# Patient Record
Sex: Male | Born: 1980 | Race: Black or African American | Hispanic: No | Marital: Single | Smoking: Former smoker
Health system: Southern US, Community
[De-identification: ages and names within clinical notes are randomized; demographics above are authoritative.]

## PROBLEM LIST (undated history)

## (undated) DIAGNOSIS — F419 Anxiety disorder, unspecified: Secondary | ICD-10-CM

## (undated) DIAGNOSIS — K219 Gastro-esophageal reflux disease without esophagitis: Secondary | ICD-10-CM

## (undated) DIAGNOSIS — J45909 Unspecified asthma, uncomplicated: Secondary | ICD-10-CM

## (undated) HISTORY — DX: Anxiety disorder, unspecified: F41.9

## (undated) HISTORY — DX: Gastro-esophageal reflux disease without esophagitis: K21.9

## (undated) HISTORY — DX: Unspecified asthma, uncomplicated: J45.909

---

## 1999-05-26 ENCOUNTER — Encounter: Payer: Self-pay | Admitting: Emergency Medicine

## 1999-05-26 ENCOUNTER — Inpatient Hospital Stay (HOSPITAL_COMMUNITY): Admission: EM | Admit: 1999-05-26 | Discharge: 1999-05-27 | Payer: Self-pay | Admitting: Emergency Medicine

## 1999-05-27 ENCOUNTER — Encounter: Payer: Self-pay | Admitting: Internal Medicine

## 1999-06-01 ENCOUNTER — Ambulatory Visit (HOSPITAL_COMMUNITY): Admission: RE | Admit: 1999-06-01 | Discharge: 1999-06-01 | Payer: Self-pay | Admitting: Critical Care Medicine

## 1999-06-01 ENCOUNTER — Encounter: Payer: Self-pay | Admitting: Critical Care Medicine

## 1999-08-26 ENCOUNTER — Ambulatory Visit (HOSPITAL_COMMUNITY): Admission: RE | Admit: 1999-08-26 | Discharge: 1999-08-26 | Payer: Self-pay | Admitting: *Deleted

## 1999-08-26 ENCOUNTER — Encounter: Payer: Self-pay | Admitting: *Deleted

## 2000-05-16 ENCOUNTER — Encounter: Payer: Self-pay | Admitting: Internal Medicine

## 2000-05-16 ENCOUNTER — Inpatient Hospital Stay (HOSPITAL_COMMUNITY): Admission: EM | Admit: 2000-05-16 | Discharge: 2000-05-17 | Payer: Self-pay | Admitting: Emergency Medicine

## 2000-08-18 ENCOUNTER — Emergency Department (HOSPITAL_COMMUNITY): Admission: EM | Admit: 2000-08-18 | Discharge: 2000-08-18 | Payer: Self-pay | Admitting: Emergency Medicine

## 2000-08-18 ENCOUNTER — Encounter: Payer: Self-pay | Admitting: Emergency Medicine

## 2005-07-23 ENCOUNTER — Emergency Department (HOSPITAL_COMMUNITY): Admission: EM | Admit: 2005-07-23 | Discharge: 2005-07-23 | Payer: Self-pay | Admitting: Emergency Medicine

## 2008-07-03 ENCOUNTER — Emergency Department (HOSPITAL_COMMUNITY): Admission: EM | Admit: 2008-07-03 | Discharge: 2008-07-03 | Payer: Self-pay | Admitting: Emergency Medicine

## 2010-03-23 ENCOUNTER — Emergency Department (HOSPITAL_COMMUNITY): Admission: EM | Admit: 2010-03-23 | Discharge: 2010-03-23 | Payer: Self-pay | Admitting: Emergency Medicine

## 2011-01-22 NOTE — H&P (Signed)
[place]. [facility]  Patient:    Edward Chaney, Edward Chaney                     MRN: [id number] Adm. Date:  [id number] Disc. Date: [id number] Attending:  Young, Copy                         History and Physical  ADMITTING DIAGNOSIS:  Status asthmaticus.  HISTORY OF PRESENT ILLNESS:  A 30 year old nonsmoking African-American man with chronic asthma, who experienced onset of an acute attack about 9 p.m. on the evening before admission.  He denies recent cold or trigger.  He presented to the emergency room during the night and was still wheezing after continuous nebulizer treatment of steroids and magnesium and is admitted for stabilization.  REVIEW OF SYSTEMS:  Denies pain, fever, sore throat, purulent sputum, GI upset of palpitation.  ALLERGIES:  No medication allergies.  FAMILY HISTORY:  Uncle with diabetes.  Mother with hypertension.  Aunt hypertensive.  Both parents are living.  SOCIAL HISTORY:  Nonsmoking.  Currently a senior at [employer]. Married.  HOME MEDICATIONS:  Advair inhaler, Accolate, Claritin, albuterol.  PAST MEDICAL HISTORY:  No surgery.  No significant illnesses other than intermittent asthma, with previous hospitalization for asthma.  No history of heart disease, diabetes, or hypertension.  PHYSICAL EXAMINATION:  GENERAL:  Tired and drowsy, having been awake most of the night, but alert and fully oriented, appropriate, and responsive.  A well-developed, well-nourished black male.  VITAL SIGNS:  Room air saturations 94%, temperature 96.7, blood pressure 148/75, pulse 91, respirations 26.  SKIN:  No rash.  ADENOPATHY:  None found.  HEENT:  Atraumatic.  Pupils equal and reactive, conjunctivae clear.  Oral mucosa clear.  No stridor or neck vein distention.  No thyromegaly.  CHEST:  Not using accessory muscles by the time I examined him.  Lying with bed at 30 degrees head of bed elevation.  Bilateral inspiratory and  expiratory wheezes throughout the chest, speaking in short sentences, with no cough.  HEART:  Regular rhythm, about 90 per minute.  No murmur or rub.  ABDOMEN:  Soft, nontender, without hepatosplenomegaly.  GENITALIA/RECTAL:  Not examined, not indicated at this time.  EXTREMITIES:  No clubbing, cyanosis, or edema.  No tremor.  ADDENDUM:  Subsequently told that he has been treated through [facility] for a urinary tract infection on Floxin, which he has nearly completed.  IMPRESSION:  Status asthmaticus.  In any young adult, I always question whether the attack has been building longer than he recognized, and I need to determine whether he has been able to be compliant with medication, although that has not usually been a problem with this individual.  We will spend more time on education, and he has an early outpatient office visit already scheduled.  He is being admitted for intravenous steroids, bronchodilators, and antibiotics.  Doxycycline ordered initially is being replaced with Floxin to continue his urinary tract therapy while also covering any possible airway bacterial infection. DD:  05/17/00 TD:  05/17/00 Job: [id number]

## 2011-01-22 NOTE — Discharge Summary (Signed)
[name]. [facility]  Patient:    Edward Chaney, Edward Chaney                     MRN: [id number] Adm. Date:  [id number] Disc. Date: [id number] Attending:  Jetty Duhamel Driver                           Discharge Summary  ADMISSION DIAGNOSIS:  Status asthmaticus.  BRIEF HISTORY:  A 30 year old, nonsmoking, African-American male with chronic asthma, who presented with an asthma attack about 9 p.m. on the night prior to admission, not having recognized any obvious triggers.  He continued wheezing after continuous nebulizer treatment, intravenous steroids, and magnesium in the emergency room and was admitted for stabilization.  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  PHYSICAL EXAMINATION:  He was tired from having been awake all night.  VITAL SIGNS:  The room air saturation was 94%.  Respiratory rate elevated at 26.  Temperature 96.7 degrees.  CHEST:  Bilateral inspiratory and expiratory wheezes and he was speaking in short sentences.  HOSPITAL COURSE:  He was placed on intravenous steroids, aerosolized bronchodilators, and doxycycline and had an educational discussion regarding peak flow meters, the warning signs for asthma, and medication use.  He improved significantly and by the next morning was comfortable and ready for discharge with lung fields clear, pulse normal, and breathing unlabored.  He was noted to have an incidental urinary tract infection which was already being treated with Floxin through [facility] and this was continued.  LABORATORY DATA:  WBC 11,000, hemoglobin 12.9, 1% eosinophils, 4% lymphocytes. Admission potassium 3.4, glucose 126.  The urinalysis showed 6-10 white blood cells with rare bacteria.  Room air saturation was 96%.  DISCHARGE MEDICATIONS: 1. Advair Diskus one inhalation b.i.d. 2.  Accolate 20 mg b.i.d. 3. Albuterol inhaler two puffs q.i.d. p.r.n. 4. Claritin 10 mg p.o. q.d. p.r.n. 5. Floxin 200 mg b.i.d. to finish course. 6. Prednisone 5 mg tablets, #40, to taper over eight days from 40 mg.  DIET:  Unrestricted.  ACTIVITY:  Unrestricted.  FOLLOW-UP:  He is to keep an office follow-up appointment already scheduled for May 19, 2000. DD:  06/02/00 TD:  06/02/00 Job: [id number]

## 2011-10-22 ENCOUNTER — Other Ambulatory Visit: Payer: Self-pay | Admitting: Neurology

## 2011-10-22 DIAGNOSIS — M8560 Other cyst of bone, unspecified site: Secondary | ICD-10-CM

## 2011-10-27 ENCOUNTER — Other Ambulatory Visit: Payer: Self-pay

## 2011-12-23 ENCOUNTER — Other Ambulatory Visit: Payer: Self-pay

## 2012-01-14 ENCOUNTER — Institutional Professional Consult (permissible substitution): Payer: Self-pay | Admitting: Internal Medicine

## 2012-02-14 ENCOUNTER — Other Ambulatory Visit: Payer: Self-pay

## 2012-02-16 ENCOUNTER — Other Ambulatory Visit: Payer: Self-pay

## 2012-02-18 ENCOUNTER — Ambulatory Visit
Admission: RE | Admit: 2012-02-18 | Discharge: 2012-02-18 | Disposition: A | Discharge status: Home / Self Care | Payer: Managed Care, Other (non HMO) | Source: Ambulatory Visit | Attending: Neurology | Admitting: Neurology

## 2012-02-18 DIAGNOSIS — M8560 Other cyst of bone, unspecified site: Secondary | ICD-10-CM

## 2012-02-18 MED ORDER — IOHEXOL 300 MG/ML  SOLN
100.0000 mL | Freq: Once | INTRAMUSCULAR | Status: AC | PRN
  Administered 2012-02-18: 100 mL via INTRAVENOUS

## 2012-02-21 ENCOUNTER — Institutional Professional Consult (permissible substitution): Payer: Self-pay | Admitting: Internal Medicine

## 2012-03-14 ENCOUNTER — Ambulatory Visit (INDEPENDENT_AMBULATORY_CARE_PROVIDER_SITE_OTHER): Payer: Managed Care, Other (non HMO) | Admitting: Internal Medicine

## 2012-03-14 ENCOUNTER — Encounter: Payer: Self-pay | Admitting: Internal Medicine

## 2012-03-14 VITALS — BP 124/88 | HR 49 | Ht 71.5 in | Wt 188.4 lb

## 2012-03-14 DIAGNOSIS — J45998 Other asthma: Secondary | ICD-10-CM

## 2012-03-14 DIAGNOSIS — J45909 Unspecified asthma, uncomplicated: Secondary | ICD-10-CM

## 2012-03-14 DIAGNOSIS — K219 Gastro-esophageal reflux disease without esophagitis: Secondary | ICD-10-CM

## 2012-03-14 MED ORDER — ALBUTEROL SULFATE HFA 108 (90 BASE) MCG/ACT IN AERS: 2.0000 | INHALATION_SPRAY | RESPIRATORY_TRACT | Status: DC | PRN

## 2012-03-14 MED ORDER — MOMETASONE FURO-FORMOTEROL FUM 100-5 MCG/ACT IN AERO: 2.0000 | INHALATION_SPRAY | Freq: Two times a day (BID) | RESPIRATORY_TRACT | Status: DC

## 2012-03-14 NOTE — Progress Notes (Signed)
79/13 - 44 yoM  Former smoker here at kind request of Dr.Pharr, concerned about asthma. He was a previous patient at the old office when he was diagnosed in 51. He had been generally well-controlled using Advair. This year he began having more trouble during the spring weather, with increased wheeze and shortness of breath. Easier dyspnea on exertion. Sample of Advair gave him a sore throat but otherwise helped. He has just been using his rescue inhaler. Asthma wakes him. Admits frequent heartburn and we discussed possible relation to her asthma. Blows his nose each morning but otherwise not much nasal complaints. Gets regular flu shot. He is old records are being brought from warehouse. Family history of several of asthma. Smoked 2-3 cigarettes daily for 5 years before quitting in 2011. 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 He denies significant mold or respiratory distress associated with his home or work place. He is doing well currently, but wanted to reestablish, anticipating future needs.  Prior to Admission medications   Medication Sig Start Date End Date Taking? Authorizing Provider  albuterol (PROAIR HFA) 108 (90 BASE) MCG/ACT inhaler Inhale 2 puffs into the lungs 4 (four) times daily as needed.   Yes Historical Provider, MD  albuterol (PROVENTIL HFA;VENTOLIN HFA) 108 (90 BASE) MCG/ACT inhaler Inhale 2 puffs into the lungs every 4 (four) hours as needed for wheezing or shortness of breath. 03/14/12 03/14/13  Waymon Budge, MD  mometasone-formoterol (DULERA) 100-5 MCG/ACT AERO Inhale 2 puffs into the lungs 2 (two) times daily. 03/14/12 03/14/13  Waymon Budge, MD   Past Medical History  Diagnosis Date  . Asthma    History reviewed. No pertinent past surgical history. Family History  Problem Relation Age of Onset  . Allergies Mother   . Asthma Maternal Aunt   . Asthma Maternal Uncle   .  Asthma Paternal Grandfather    History   Social History  . Marital Status: Single    Spouse Name: N/A    Number of Children: 2  . Years of Education: N/A   Occupational History  .  [employer]   Social History Main Topics  . Smoking status: Former Smoker -- 0.2 packs/day for 6 years    Types: Cigarettes    Quit date: 09/06/2009  . Smokeless tobacco: Not on file  . Alcohol Use: 4.2 oz/week    7 Cans of beer per week  . Drug Use: No  . Sexually Active: Not on file   Other Topics Concern  . Not on file   Social History Narrative  . No narrative on file   ROS-see HPI Constitutional:   No-   weight loss, night sweats, fevers, chills, fatigue, lassitude. HEENT:   No-  headaches, difficulty swallowing, tooth/dental problems, sore throat,       No-  sneezing, itching, ear ache, nasal congestion, post nasal drip,  CV:  No-   chest pain, orthopnea, PND, swelling in lower extremities, anasarca, dizziness, palpitations Resp: +  shortness of breath with exertion or at rest.              No-   productive cough,  No non-productive cough,  No- coughing up of blood.              No-   change in color of mucus.  No- wheezing.   Skin: No-   rash or lesions. GI:  No-   heartburn, indigestion, abdominal pain, nausea, vomiting, diarrhea,                 change in bowel habits, loss of appetite GU: No-   dysuria, change in color of urine, no urgency or frequency.  No- flank pain. MS:  No-   joint pain or swelling.  No- decreased range of motion.  No- back pain. Neuro-     nothing unusual Psych:  No- change in mood or affect. No depression or anxiety.  No memory loss.  OBJ- Physical Exam General- Alert, Oriented, Affect-appropriate, Distress- none acute, well-appearing Skin- rash-none, lesions- none, excoriation- none Lymphadenopathy- none Head- atraumatic            Eyes- Gross vision intact, PERRLA, conjunctivae and secretions clear            Ears- Hearing, canals-normal             Nose- Clear, no-Septal dev, mucus, polyps, erosion, perforation             Throat- Mallampati II , mucosa clear , drainage- none, tonsils- atrophic Neck- flexible , trachea midline, no stridor , thyroid nl, carotid no bruit Chest - symmetrical excursion , unlabored           Heart/CV- RRR , no murmur , no gallop  , no rub, nl s1 s2                           - JVD- none , edema- none, stasis changes- none, varices- none           Lung- clear to P&A, wheeze- none, cough- none , dullness-none, rub- none           Chest wall-  Abd- tender-no, distended-no, bowel sounds-present, HSM- no Br/ Gen/ Rectal- Not done, not indicated Extrem- cyanosis- none, clubbing, none, atrophy- none, strength- nl Neuro- grossly intact to observation

## 2012-03-14 NOTE — Patient Instructions (Addendum)
Script for proair albuterol rescue inhaler   Sample and script for Dulera 100      2 puffs then rinse mouth, twice daily  Maintenance controller  Suggest regular use of an otc acid blocker like omeprazole or famotidine

## 2012-03-21 DIAGNOSIS — K219 Gastro-esophageal reflux disease without esophagitis: Secondary | ICD-10-CM | POA: Insufficient documentation

## 2012-03-21 DIAGNOSIS — J45998 Other asthma: Secondary | ICD-10-CM | POA: Insufficient documentation

## 2012-03-21 NOTE — Assessment & Plan Note (Addendum)
Long-standing history of asthma. He is currently under good control but it sounds as if GERD and maybe allergy and viral infections have been important triggers. We discussed controller versus rescue medications. Plan-prescription refill for pro air. Introduce Dulera.

## 2012-03-21 NOTE — Assessment & Plan Note (Signed)
He recognizes some night time waking we discussed the importance of control and the potential to aggravate asthma. He will talk with Dr. Renne Crigler about management.

## 2012-11-03 ENCOUNTER — Encounter: Payer: Self-pay | Admitting: Internal Medicine

## 2012-11-29 ENCOUNTER — Encounter (HOSPITAL_COMMUNITY): Payer: Self-pay | Admitting: *Deleted

## 2012-11-29 ENCOUNTER — Emergency Department (HOSPITAL_COMMUNITY)
Admission: EM | Admit: 2012-11-29 | Discharge: 2012-11-29 | Disposition: A | Discharge status: Home / Self Care | Payer: Managed Care, Other (non HMO) | Attending: Emergency Medicine | Admitting: Emergency Medicine

## 2012-11-29 DIAGNOSIS — R7309 Other abnormal glucose: Secondary | ICD-10-CM | POA: Insufficient documentation

## 2012-11-29 DIAGNOSIS — J3489 Other specified disorders of nose and nasal sinuses: Secondary | ICD-10-CM | POA: Insufficient documentation

## 2012-11-29 DIAGNOSIS — R109 Unspecified abdominal pain: Secondary | ICD-10-CM | POA: Insufficient documentation

## 2012-11-29 DIAGNOSIS — R112 Nausea with vomiting, unspecified: Secondary | ICD-10-CM | POA: Insufficient documentation

## 2012-11-29 DIAGNOSIS — Z79899 Other long term (current) drug therapy: Secondary | ICD-10-CM | POA: Insufficient documentation

## 2012-11-29 DIAGNOSIS — Z87891 Personal history of nicotine dependence: Secondary | ICD-10-CM | POA: Insufficient documentation

## 2012-11-29 DIAGNOSIS — B349 Viral infection, unspecified: Secondary | ICD-10-CM

## 2012-11-29 DIAGNOSIS — R197 Diarrhea, unspecified: Secondary | ICD-10-CM | POA: Insufficient documentation

## 2012-11-29 DIAGNOSIS — B9789 Other viral agents as the cause of diseases classified elsewhere: Secondary | ICD-10-CM | POA: Insufficient documentation

## 2012-11-29 DIAGNOSIS — R739 Hyperglycemia, unspecified: Secondary | ICD-10-CM

## 2012-11-29 LAB — CBC WITH DIFFERENTIAL/PLATELET
Basophils Absolute: 0 10*3/uL (ref 0.0–0.1)
Basophils Relative: 0 % (ref 0–1)
Eosinophils Relative: 0 % (ref 0–5)
HCT: 44.8 % (ref 39.0–52.0)
Hemoglobin: 15.5 g/dL (ref 13.0–17.0)
MCHC: 34.6 g/dL (ref 30.0–36.0)
MCV: 94.1 fL (ref 78.0–100.0)
Monocytes Absolute: 0.3 10*3/uL (ref 0.1–1.0)
Monocytes Relative: 3 % (ref 3–12)
Neutro Abs: 7.9 10*3/uL — ABNORMAL HIGH (ref 1.7–7.7)
RDW: 12.1 % (ref 11.5–15.5)

## 2012-11-29 LAB — COMPREHENSIVE METABOLIC PANEL
Albumin: 4.6 g/dL (ref 3.5–5.2)
BUN: 13 mg/dL (ref 6–23)
CO2: 23 mEq/L (ref 19–32)
Calcium: 9.7 mg/dL (ref 8.4–10.5)
Chloride: 101 mEq/L (ref 96–112)
Creatinine, Ser: 1.17 mg/dL (ref 0.50–1.35)
GFR calc non Af Amer: 81 mL/min — ABNORMAL LOW (ref >90)
Total Bilirubin: 0.9 mg/dL (ref 0.3–1.2)

## 2012-11-29 LAB — LIPASE, BLOOD: Lipase: 16 U/L (ref 11–59)

## 2012-11-29 MED ORDER — ONDANSETRON 8 MG PO TBDP: ORAL_TABLET | ORAL | Status: DC

## 2012-11-29 MED ORDER — ONDANSETRON 4 MG PO TBDP
8.0000 mg | ORAL_TABLET | Freq: Once | ORAL | Status: AC
  Administered 2012-11-29: 8 mg via ORAL
  Filled 2012-11-29: qty 2

## 2012-11-29 NOTE — ED Provider Notes (Signed)
History     CSN: [id number]  Arrival date & time 11/29/12  2031   First MD Initiated Contact with Patient 11/29/12 2217      Chief Complaint  Patient presents with  . Abdominal Pain   HPI  History provided by the patient. Patient is a 32 year old male with past history of asthma who presents with complaints of acute onset nausea, vomiting and watery diarrhea. Symptoms first began with diarrhea early this morning followed by a blood cells of nausea vomiting. He also began to have bilateral lower abdominal discomfort radiating to the back muscles. Symptoms were also associated with fevers and chills at home. Patient did use some NyQuil for symptoms initially do to recent congestion and rhinorrhea and slight cough 3 days ago. This seemed to help some with his fever symptoms but he did have some body aching. Patient was seen a local urgent care Center where he was advised to have further evaluation emergency room. Diarrhea is watery without blood or mucus. He denies any hematemesis. He has been able to take some small sips of water and ginger ale. No other aggravating or alleviating factors. No other associated symptoms. No recent travel or known sick contacts.    Past Medical History  Diagnosis Date  . Asthma     History reviewed. No pertinent past surgical history.  Family History  Problem Relation Age of Onset  . Allergies Mother   . Asthma Maternal Aunt   . Asthma Maternal Uncle   . Asthma Paternal Grandfather     History  Substance Use Topics  . Smoking status: Former Smoker -- 0.20 packs/day for 6 years    Types: Cigarettes    Quit date: 09/06/2009  . Smokeless tobacco: Not on file  . Alcohol Use: 4.2 oz/week    7 Cans of beer per week      Review of Systems  Constitutional: Positive for fever, chills and appetite change.  HENT: Positive for congestion and rhinorrhea.   Respiratory: Negative for cough.   Gastrointestinal: Positive for nausea, vomiting, abdominal  pain and diarrhea.  All other systems reviewed and are negative.    Allergies  Review of patient's allergies indicates no known allergies.  Home Medications   Current Outpatient Rx  Name  Route  Sig  Dispense  Refill  . acetaminophen (TYLENOL) 500 MG tablet   Oral   Take 1,000 mg by mouth every 6 (six) hours as needed for pain.         Marland Kitchen albuterol (PROAIR HFA) 108 (90 BASE) MCG/ACT inhaler   Inhalation   Inhale 2 puffs into the lungs 4 (four) times daily as needed for wheezing or shortness of breath.          . Pseudoeph-Doxylamine-DM-APAP (NYQUIL D COLD/FLU) 60-12.02-02-999 MG/30ML LIQD   Oral   Take 30 mLs by mouth daily as needed (for cold/fever symptoms).           BP 153/89  Pulse 93  Temp(Src) 98.2 F (36.8 C) (Oral)  Resp 16  SpO2 96%  Physical Exam  Nursing note and vitals reviewed. Constitutional: He is oriented to person, place, and time. He appears well-developed and well-nourished. No distress.  HENT:  Head: Normocephalic.  Cardiovascular: Normal rate and regular rhythm.   Pulmonary/Chest: Effort normal and breath sounds normal. No respiratory distress.  Abdominal: Soft. There is no tenderness. There is no rebound, no guarding, no CVA tenderness, no tenderness at McBurney's point and negative Murphy's sign.  Musculoskeletal: Normal range  of motion.  Neurological: He is alert and oriented to person, place, and time.  Skin: Skin is warm.  Psychiatric: He has a normal mood and affect. His behavior is normal.    ED Course  Procedures   Results for orders placed during the hospital encounter of 11/29/12  CBC WITH DIFFERENTIAL      Result Value Range   WBC 8.6  4.0 - 10.5 K/uL   RBC 4.76  4.22 - 5.81 MIL/uL   Hemoglobin 15.5  13.0 - 17.0 g/dL   HCT 40.9  81.1 - 91.4 %   MCV 94.1  78.0 - 100.0 fL   MCH 32.6  26.0 - 34.0 pg   MCHC 34.6  30.0 - 36.0 g/dL   RDW 78.2  95.6 - 21.3 %   Platelets 227  150 - 400 K/uL   Neutrophils Relative 91 (*) 43 -  77 %   Neutro Abs 7.9 (*) 1.7 - 7.7 K/uL   Lymphocytes Relative 5 (*) 12 - 46 %   Lymphs Abs 0.5 (*) 0.7 - 4.0 K/uL   Monocytes Relative 3  3 - 12 %   Monocytes Absolute 0.3  0.1 - 1.0 K/uL   Eosinophils Relative 0  0 - 5 %   Eosinophils Absolute 0.0  0.0 - 0.7 K/uL   Basophils Relative 0  0 - 1 %   Basophils Absolute 0.0  0.0 - 0.1 K/uL  COMPREHENSIVE METABOLIC PANEL      Result Value Range   Sodium 136  135 - 145 mEq/L   Potassium 4.2  3.5 - 5.1 mEq/L   Chloride 101  96 - 112 mEq/L   CO2 23  19 - 32 mEq/L   Glucose, Bld 190 (*) 70 - 99 mg/dL   BUN 13  6 - 23 mg/dL   Creatinine, Ser 0.86  0.50 - 1.35 mg/dL   Calcium 9.7  8.4 - 57.8 mg/dL   Total Protein 8.4 (*) 6.0 - 8.3 g/dL   Albumin 4.6  3.5 - 5.2 g/dL   AST 29  0 - 37 U/L   ALT 19  0 - 53 U/L   Alkaline Phosphatase 42  39 - 117 U/L   Total Bilirubin 0.9  0.3 - 1.2 mg/dL   GFR calc non Af Amer 81 (*) >90 mL/min   GFR calc Af Amer >90  >90 mL/min  LIPASE, BLOOD      Result Value Range   Lipase 16  11 - 59 U/L   Anion gap 12     1. Viral syndrome   2. Nausea vomiting and diarrhea   3. Abdominal pain   4. Hyperglycemia       MDM  10:30PM patient seen and evaluated. Patient well-appearing in no acute distress. He does not appear severely ill or toxic. He has a soft nontender abdominal exam. No peritoneal signs. Patient with normal WBC. At this time I have low suspicion for acute abdominal process or appendicitis. Symptoms of acute onset nausea vomiting and watery diarrhea more typical of a viral GI process.  Patient does have elevated glucose. This may be heightened response to infection however he was strongly encouraged to have continued evaluation for the possibilities of diabetes. No signs of DKA as anion gap is 12.  Patient is tolerating by mouth fluids. I did discuss the need for continued evaluation of symptoms and to return immediately for any worsening pain or persistent nausea vomiting. We discussed options  for abdominal CT scan  today including the benefits, risks and alternatives at this time patient does not wish to have a CT scan and prefers to have symptomatic treatment.       Angus Seller, PA-C 11/29/12 [phone]

## 2012-11-29 NOTE — ED Provider Notes (Signed)
Medical screening examination/treatment/procedure(s) were performed by non-physician practitioner and as supervising physician I was immediately available for consultation/collaboration.  Umaima Scholten R. Fahed Morten, MD 11/29/12 2349 

## 2012-11-29 NOTE — ED Notes (Signed)
Pt c/o body aches, fever, n/v/d, and abdominal pain since this morning.

## 2012-11-29 NOTE — ED Notes (Signed)
Pt able to tolerate PO fluids without becoming nauseous.  ED PA Theron Arista notified.  Pt will be d/ced.

## 2013-02-24 ENCOUNTER — Ambulatory Visit (INDEPENDENT_AMBULATORY_CARE_PROVIDER_SITE_OTHER): Payer: Managed Care, Other (non HMO) | Admitting: Physician Assistant

## 2013-02-24 VITALS — BP 149/92 | HR 61 | Temp 97.8°F | Resp 16 | Ht 71.5 in | Wt 178.0 lb

## 2013-02-24 DIAGNOSIS — Z202 Contact with and (suspected) exposure to infections with a predominantly sexual mode of transmission: Secondary | ICD-10-CM

## 2013-02-24 MED ORDER — AZITHROMYCIN 500 MG PO TABS: 1000.0000 mg | ORAL_TABLET | Freq: Once | ORAL | Status: DC

## 2013-02-24 NOTE — Progress Notes (Signed)
  Subjective:    Patient ID: Edward Chaney, male    DOB: December 02, 1980, 32 y.o.   MRN: [id number]  HPI   Edward Chaney is a very pleasant 32 yr old male here desiring STD testing.  States that a couple days ago his partner notified him that she tested positive for chlamydia and that he should be tested.  He did not a little discharge from the penis today, though he has difficulty describing it.  This is his only partner, states he uses condoms "110%" of the time.  Was tested about 4 months ago, all negative.  Denies dysuria, abd pain, fever, chills, rashes/lesions/sores.   Review of Systems  Constitutional: Negative for fever and chills.  HENT: Negative.   Respiratory: Negative.   Cardiovascular: Negative.   Gastrointestinal: Negative.   Genitourinary: Positive for discharge. Negative for dysuria, genital sores, penile pain and testicular pain.  Musculoskeletal: Negative.   Skin: Negative.   Neurological: Negative.        Objective:   Physical Exam  Vitals reviewed. Constitutional: He is oriented to person, place, and time. He appears well-developed and well-nourished. No distress.  HENT:  Head: Normocephalic and atraumatic.  Eyes: Conjunctivae are normal. No scleral icterus.  Cardiovascular: Normal rate, regular rhythm and normal heart sounds.   Pulmonary/Chest: Effort normal and breath sounds normal. He has no wheezes. He has no rales.  Abdominal: Soft. There is no tenderness.  Genitourinary: Penis normal. No discharge found.  Neurological: He is alert and oriented to person, place, and time.  Skin: Skin is warm and dry.  Psychiatric: He has a normal mood and affect. His behavior is normal.          Assessment & Plan:  Exposure to chlamydia - Plan: GC/Chlamydia Probe Amp, azithromycin (ZITHROMAX) 500 MG tablet   Edward Chaney is a very pleasant 32 yr old male here for STD testing after exposure to chlamydia.  He declines HIV and RPR today.  Genprobe collected.  Given known  exposure to chlamydia, will treat with 1g azithro x 1.  Will treat for gonorrhea should genprobe return positive.  Instructed patient to abstain from intercourse for the next 7 days.  Encouraged condom use with every encounter.

## 2013-02-24 NOTE — Patient Instructions (Addendum)
Take the azithromycin pills together today.  Abstain from sexual intercourse for the next 7 days to prevent passing the infection back to your partner.  I will let you know when your labs are back and if we have to treat anything else at that time.   Safe Sex Safe sex is about reducing the risk of giving or getting a sexually transmitted disease (STD). STDs are spread through sexual contact involving the genitals, mouth, or rectum. Some STDS can be cured and others cannot. Safe sex can also prevent unintended pregnancies.  SAFE SEX PRACTICES  Limit your sexual activity to only one partner who is only having sex with you.  Talk to your partner about their past partners, past STDs, and drug use.  Use a condom every time you have sexual intercourse. This includes vaginal, oral, and anal sexual activity. Both females and males should wear condoms during oral sex. Only use latex or polyurethane condoms and water-based lubricants. Petroleum-based lubricants or oils used to lubricate a condom will weaken the condom and increase the chance that it will break. The condom should be in place from the beginning to the end of sexual activity. Wearing a condom reduces, but does not completely eliminate, your risk of getting or giving a STD. STDs can be spread by contact with skin of surrounding areas.  Get vaccinated for hepatitis B and HPV.  Avoid alcohol and recreational drugs which can affect your judgement. You may forget to use a condom or participate in high-risk sex.  For females, avoid douching after sexual intercourse. Douching can spread an infection farther into the reproductive tract.  Check your body for signs of sores, blisters, rashes, or unusual discharge. See your caregiver if you notice any of these signs.  Avoid sexual contact if you have symptoms of an infection or are being treated for an STD. If you or your partner has herpes, avoid sexual contact when blisters are present. Use condoms  at all other times.  See your caregiver for regular screenings, examinations, and tests for STDs. Before having sex with a new partner, each of you should be screened for STDs and talk about the results with your partner. BENEFITS OF SAFE SEX   There is less of a chance of getting or giving an STD.  You can prevent unwanted or unintended pregnancies.  By discussing safer sex concerns with your partner, you may increase feelings of intimacy, comfort, trust, and honesty between the both of you. Document Released: 09/30/2004 Document Revised: 05/17/2012 Document Reviewed: 02/14/2012 [facility] Patient Information 2014 [place], [place].

## 2013-02-26 LAB — GC/CHLAMYDIA PROBE AMP
CT Probe RNA: NEGATIVE
GC Probe RNA: NEGATIVE

## 2013-03-14 ENCOUNTER — Ambulatory Visit: Payer: Managed Care, Other (non HMO) | Admitting: Internal Medicine

## 2016-01-19 ENCOUNTER — Emergency Department (HOSPITAL_COMMUNITY): Payer: Managed Care, Other (non HMO)

## 2016-01-19 ENCOUNTER — Encounter (HOSPITAL_COMMUNITY): Payer: Self-pay | Admitting: *Deleted

## 2016-01-19 ENCOUNTER — Emergency Department (HOSPITAL_COMMUNITY)
Admission: EM | Admit: 2016-01-19 | Discharge: 2016-01-19 | Disposition: A | Discharge status: Home / Self Care | Payer: Managed Care, Other (non HMO)

## 2016-01-19 ENCOUNTER — Emergency Department (HOSPITAL_COMMUNITY)
Admission: EM | Admit: 2016-01-19 | Discharge: 2016-01-19 | Disposition: A | Discharge status: Home / Self Care | Payer: Managed Care, Other (non HMO) | Attending: Emergency Medicine | Admitting: Emergency Medicine

## 2016-01-19 DIAGNOSIS — J45909 Unspecified asthma, uncomplicated: Secondary | ICD-10-CM | POA: Insufficient documentation

## 2016-01-19 DIAGNOSIS — Z79899 Other long term (current) drug therapy: Secondary | ICD-10-CM | POA: Insufficient documentation

## 2016-01-19 DIAGNOSIS — Z87891 Personal history of nicotine dependence: Secondary | ICD-10-CM | POA: Diagnosis not present

## 2016-01-19 DIAGNOSIS — K529 Noninfective gastroenteritis and colitis, unspecified: Secondary | ICD-10-CM | POA: Diagnosis not present

## 2016-01-19 DIAGNOSIS — R109 Unspecified abdominal pain: Secondary | ICD-10-CM | POA: Diagnosis present

## 2016-01-19 LAB — COMPREHENSIVE METABOLIC PANEL
ALK PHOS: 44 U/L (ref 38–126)
ALT: 17 U/L (ref 17–63)
ANION GAP: 14 (ref 5–15)
AST: 25 U/L (ref 15–41)
Albumin: 4.6 g/dL (ref 3.5–5.0)
BILIRUBIN TOTAL: 1.1 mg/dL (ref 0.3–1.2)
BUN: 9 mg/dL (ref 6–20)
CALCIUM: 9.9 mg/dL (ref 8.9–10.3)
CO2: 22 mmol/L (ref 22–32)
Chloride: 102 mmol/L (ref 101–111)
Creatinine, Ser: 1.13 mg/dL (ref 0.61–1.24)
GFR calc Af Amer: >60 mL/min (ref >60)
GLUCOSE: 122 mg/dL — AB (ref 65–99)
Potassium: 3.5 mmol/L (ref 3.5–5.1)
Sodium: 138 mmol/L (ref 135–145)
Total Protein: 8.2 g/dL — ABNORMAL HIGH (ref 6.5–8.1)

## 2016-01-19 LAB — CBC
HEMATOCRIT: 44.4 % (ref 39.0–52.0)
HEMOGLOBIN: 14.7 g/dL (ref 13.0–17.0)
MCH: 31.3 pg (ref 26.0–34.0)
MCHC: 33.1 g/dL (ref 30.0–36.0)
MCV: 94.5 fL (ref 78.0–100.0)
Platelets: 248 10*3/uL (ref 150–400)
RBC: 4.7 MIL/uL (ref 4.22–5.81)
RDW: 12 % (ref 11.5–15.5)
WBC: 6.5 10*3/uL (ref 4.0–10.5)

## 2016-01-19 LAB — URINE MICROSCOPIC-ADD ON

## 2016-01-19 LAB — URINALYSIS, ROUTINE W REFLEX MICROSCOPIC
GLUCOSE, UA: NEGATIVE mg/dL
Hgb urine dipstick: NEGATIVE
Ketones, ur: 40 mg/dL — AB
NITRITE: NEGATIVE
PH: 5.5 (ref 5.0–8.0)
Protein, ur: 30 mg/dL — AB
SPECIFIC GRAVITY, URINE: 1.033 — AB (ref 1.005–1.030)

## 2016-01-19 LAB — LIPASE, BLOOD: LIPASE: 18 U/L (ref 11–51)

## 2016-01-19 MED ORDER — FENTANYL CITRATE (PF) 100 MCG/2ML IJ SOLN
50.0000 ug | Freq: Once | INTRAMUSCULAR | Status: AC
  Administered 2016-01-19: 50 ug via INTRAVENOUS
  Filled 2016-01-19: qty 2

## 2016-01-19 MED ORDER — HYDROCODONE-ACETAMINOPHEN 5-325 MG PO TABS: 2.0000 | ORAL_TABLET | ORAL | Status: AC | PRN

## 2016-01-19 MED ORDER — IOPAMIDOL (ISOVUE-300) INJECTION 61%
INTRAVENOUS | Status: AC
  Administered 2016-01-19: 100 mL
  Filled 2016-01-19: qty 100

## 2016-01-19 MED ORDER — SODIUM CHLORIDE 0.9 % IV BOLUS (SEPSIS)
2000.0000 mL | Freq: Once | INTRAVENOUS | Status: AC
  Administered 2016-01-19: 2000 mL via INTRAVENOUS

## 2016-01-19 NOTE — ED Notes (Signed)
Pt reports onset Friday night of lower abd discomfort that he describes as "butterflies in his stomach." also having nausea and diarrhea. Went to pcp this am and sent here to r/o appendicitis.

## 2016-01-19 NOTE — Discharge Instructions (Signed)
°  Colitis °Colitis is inflammation of the colon. Colitis may last a short time (acute) or it may last a long time (chronic). °CAUSES °This condition may be caused by: °· Viruses. °· Bacteria. °· Reactions to medicine. °· Certain autoimmune diseases, such as Crohn disease or ulcerative colitis. °SYMPTOMS °Symptoms of this condition include: °· Diarrhea. °· Passing bloody or tarry stool. °· Pain. °· Fever. °· Vomiting. °· Tiredness (fatigue). °· Weight loss. °· Bloating. °· Sudden increase in abdominal pain. °· Having fewer bowel movements than usual. °DIAGNOSIS °This condition is diagnosed with a stool test or a blood test. You may also have other tests, including X-rays, a CT scan, or a colonoscopy. °TREATMENT °Treatment may include: °· Resting the bowel. This involves not eating or drinking for a period of time. °· Fluids that are given through an IV tube. °· Medicine for pain and diarrhea. °· Antibiotic medicines. °· Cortisone medicines. °· Surgery. °HOME CARE INSTRUCTIONS °Eating and Drinking °· Follow instructions from your health care provider about eating or drinking restrictions. °· Drink enough fluid to keep your urine clear or pale yellow. °· Work with a dietitian to determine which foods cause your condition to flare up. °· Avoid foods that cause flare-ups. °· Eat a well-balanced diet. °Medicines °· Take over-the-counter and prescription medicines only as told by your health care provider. °· If you were prescribed an antibiotic medicine, take it as told by your health care provider. Do not stop taking the antibiotic even if you start to feel better. °General Instructions °· Keep all follow-up visits as told by your health care provider. This is important. °SEEK MEDICAL CARE IF: °· Your symptoms do not go away. °· You develop new symptoms. °SEEK IMMEDIATE MEDICAL CARE IF: °· You have a fever that does not go away with treatment. °· You develop chills. °· You have extreme weakness, fainting, or  dehydration. °· You have repeated vomiting. °· You develop severe pain in your abdomen. °· You pass bloody or tarry stool. °  °This information is not intended to replace advice given to you by your health care provider. Make sure you discuss any questions you have with your health care provider. °  °Document Released: 09/30/2004 Document Revised: 05/14/2015 Document Reviewed: 12/16/2014 °Elsevier Interactive Patient Education ©2016 [employer] ° °

## 2016-01-19 NOTE — ED Provider Notes (Signed)
CSN: [id number]     Arrival date & time 01/19/16  1449 History   First MD Initiated Contact with Patient 01/19/16 1738     Chief Complaint  Patient presents with  . Abdominal Pain  . Diarrhea      HPI Patient began having loss of appetite or Friday followed by diarrhea on Saturday and Sunday and today.  He's had intermittent abdominal pain which gets better after he has a bowel movement.  No fever.  No vomiting. Past Medical History  Diagnosis Date  . Asthma    History reviewed. No pertinent past surgical history. Family History  Problem Relation Age of Onset  . Allergies Mother   . Hyperlipidemia Mother   . Asthma Maternal Aunt   . Asthma Maternal Uncle   . Asthma Paternal Grandfather   . Hyperlipidemia Father   . Hyperlipidemia Sister    Social History  Substance Use Topics  . Smoking status: Former Smoker -- 0.20 packs/day for 6 years    Types: Cigarettes    Quit date: 09/06/2009  . Smokeless tobacco: None  . Alcohol Use: 4.2 oz/week    7 Cans of beer per week    Review of Systems  Constitutional: Negative for fever.  Gastrointestinal: Positive for nausea and diarrhea. Negative for vomiting.  All other systems reviewed and are negative.     Allergies  Review of patient's allergies indicates no known allergies.  Home Medications   Prior to Admission medications   Medication Sig Start Date End Date Taking? Authorizing Provider  albuterol (PROAIR HFA) 108 (90 BASE) MCG/ACT inhaler Inhale 2 puffs into the lungs 4 (four) times daily as needed for wheezing or shortness of breath.    Yes Historical Provider, MD  azithromycin (ZITHROMAX) 500 MG tablet Take 2 tablets (1,000 mg total) by mouth once. Patient not taking: Reported on 01/19/2016 02/24/13   Godfrey Pick, PA-C  HYDROcodone-acetaminophen (NORCO/VICODIN) 5-325 MG tablet Take 2 tablets by mouth every 4 (four) hours as needed. 01/19/16   Nelva Nay, MD  ondansetron (ZOFRAN ODT) 8 MG disintegrating tablet 8mg   ODT q4 hours prn nausea Patient not taking: Reported on 01/19/2016 11/29/12   Ivonne Andrew, PA-C   BP 137/83 mmHg  Pulse 63  Temp(Src) 97.8 F (36.6 C) (Oral)  Resp 16  Ht 5' 11.5" (1.816 m)  Wt 174 lb 8 oz (79.153 kg)  BMI 24.00 kg/m2  SpO2 100% Physical Exam  Constitutional: He is oriented to person, place, and time. He appears well-developed and well-nourished. No distress.  HENT:  Head: Normocephalic and atraumatic.  Eyes: Pupils are equal, round, and reactive to light.  Neck: Normal range of motion.  Cardiovascular: Normal rate and intact distal pulses.   Pulmonary/Chest: No respiratory distress.  Abdominal: Soft. Normal appearance. He exhibits no distension. There is no tenderness. There is no rigidity, no rebound, no guarding and no tenderness at McBurney's point.  Musculoskeletal: Normal range of motion.  Neurological: He is alert and oriented to person, place, and time. No cranial nerve deficit.  Skin: Skin is warm and dry. No rash noted.  Psychiatric: He has a normal mood and affect. His behavior is normal.  Nursing note and vitals reviewed.   ED Course  Procedures (including critical care time) Medications  fentaNYL (SUBLIMAZE) injection 50 mcg (50 mcg Intravenous Given 01/19/16 1800)  sodium chloride 0.9 % bolus 2,000 mL (0 mLs Intravenous Stopped 01/19/16 2313)  fentaNYL (SUBLIMAZE) injection 50 mcg (50 mcg Intravenous Given 01/19/16 2011)  iopamidol (  ISOVUE-300) 61 % injection (100 mLs  Contrast Given 01/19/16 2103)    Labs Review Labs Reviewed  COMPREHENSIVE METABOLIC PANEL - Abnormal; Notable for the following:    Glucose, Bld 122 (*)    Total Protein 8.2 (*)    All other components within normal limits  URINALYSIS, ROUTINE W REFLEX MICROSCOPIC (NOT AT [facility]) - Abnormal; Notable for the following:    Color, Urine AMBER (*)    Specific Gravity, Urine 1.033 (*)    Bilirubin Urine SMALL (*)    Ketones, ur 40 (*)    Protein, ur 30 (*)    Leukocytes, UA TRACE (*)     All other components within normal limits  URINE MICROSCOPIC-ADD ON - Abnormal; Notable for the following:    Squamous Epithelial / LPF 0-5 (*)    Bacteria, UA FEW (*)    All other components within normal limits  LIPASE, BLOOD  CBC    Imaging Review Results for orders placed or performed during the hospital encounter of 01/19/16  Lipase, blood  Result Value Ref Range   Lipase 18 11 - 51 U/L  Comprehensive metabolic panel  Result Value Ref Range   Sodium 138 135 - 145 mmol/L   Potassium 3.5 3.5 - 5.1 mmol/L   Chloride 102 101 - 111 mmol/L   CO2 22 22 - 32 mmol/L   Glucose, Bld 122 (H) 65 - 99 mg/dL   BUN 9 6 - 20 mg/dL   Creatinine, Ser 6.961.13 0.61 - 1.24 mg/dL   Calcium 9.9 8.9 - 29.510.3 mg/dL   Total Protein 8.2 (H) 6.5 - 8.1 g/dL   Albumin 4.6 3.5 - 5.0 g/dL   AST 25 15 - 41 U/L   ALT 17 17 - 63 U/L   Alkaline Phosphatase 44 38 - 126 U/L   Total Bilirubin 1.1 0.3 - 1.2 mg/dL   GFR calc non Af Amer >60 >60 mL/min   GFR calc Af Amer >60 >60 mL/min   Anion gap 14 5 - 15  CBC  Result Value Ref Range   WBC 6.5 4.0 - 10.5 K/uL   RBC 4.70 4.22 - 5.81 MIL/uL   Hemoglobin 14.7 13.0 - 17.0 g/dL   HCT 28.444.4 13.239.0 - 44.052.0 %   MCV 94.5 78.0 - 100.0 fL   MCH 31.3 26.0 - 34.0 pg   MCHC 33.1 30.0 - 36.0 g/dL   RDW 10.212.0 72.511.5 - 36.615.5 %   Platelets 248 150 - 400 K/uL  Urinalysis, Routine w reflex microscopic  Result Value Ref Range   Color, Urine AMBER (A) YELLOW   APPearance CLEAR CLEAR   Specific Gravity, Urine 1.033 (H) 1.005 - 1.030   pH 5.5 5.0 - 8.0   Glucose, UA NEGATIVE NEGATIVE mg/dL   Hgb urine dipstick NEGATIVE NEGATIVE   Bilirubin Urine SMALL (A) NEGATIVE   Ketones, ur 40 (A) NEGATIVE mg/dL   Protein, ur 30 (A) NEGATIVE mg/dL   Nitrite NEGATIVE NEGATIVE   Leukocytes, UA TRACE (A) NEGATIVE  Urine microscopic-add on  Result Value Ref Range   Squamous Epithelial / LPF 0-5 (A) NONE SEEN   WBC, UA 0-5 0 - 5 WBC/hpf   RBC / HPF 0-5 0 - 5 RBC/hpf   Bacteria, UA FEW (A)  NONE SEEN   Urine-Other MUCOUS PRESENT    Ct Abdomen Pelvis W Contrast  01/19/2016  CLINICAL DATA:  Lower abdominal pain and diarrhea EXAM: CT ABDOMEN AND PELVIS WITH CONTRAST TECHNIQUE: Multidetector CT imaging of the abdomen  and pelvis was performed using the standard protocol following bolus administration of intravenous contrast. CONTRAST:  1 ISOVUE-300 IOPAMIDOL (ISOVUE-300) INJECTION 61% COMPARISON:  None. FINDINGS: Patient motion degrades exam. Lower chest: Lung bases are clear. Hepatobiliary: No focal hepatic lesion. No biliary duct dilatation. Gallbladder is normal. Common bile duct is normal. Pancreas: Pancreas is normal. No ductal dilatation. No pancreatic inflammation. Spleen: Normal Adrenals/urinary tract: Adrenal glands and kidneys are normal. The ureters and bladder normal. Stomach/Bowel: Stomach is normal. Mild mucosal thickening in the second portion the duodenum. Potential mild mucosal thickening of the jejunum. The ileum appears normal terminal ileum normal. Appendix is normal. There is submucosal edema of the ascending colon through the hepatic flexure into the mid transverse colon. The descending colon rectum are normal . Vascular/Lymphatic: Abdominal aorta is normal caliber. There is no retroperitoneal or periportal lymphadenopathy. No pelvic lymphadenopathy. Reproductive: Prostate normal Other: Small free fluid the pelvis Musculoskeletal: No aggressive osseous lesion. IMPRESSION: 1. Long segments of submucosal edema involving the ascending colon in proximal transverse colon is most consists with segmental colitis. In this age group differential would include infectious colitis versus inflammatory at bowel disease. 2. Mild uniform mucosal thickening of the duodenum and jejunum. This raise the suspicion for Crohn's disease. Recommend non emergent GI consultation. Electronically Signed   By: Genevive Bi M.D.   On: 01/19/2016 21:51      MDM   Final diagnoses:  Colitis         Nelva Nay, MD 01/20/16 [phone]

## 2016-01-19 NOTE — ED Notes (Signed)
Patient transported to CT 

## 2016-01-21 ENCOUNTER — Encounter: Payer: Self-pay | Admitting: Internal Medicine

## 2016-01-26 ENCOUNTER — Encounter: Payer: Self-pay | Admitting: Physician Assistant

## 2016-01-26 ENCOUNTER — Ambulatory Visit (INDEPENDENT_AMBULATORY_CARE_PROVIDER_SITE_OTHER): Payer: Managed Care, Other (non HMO) | Admitting: Physician Assistant

## 2016-01-26 VITALS — BP 112/68 | HR 68 | Ht 71.0 in | Wt 179.1 lb

## 2016-01-26 DIAGNOSIS — A09 Infectious gastroenteritis and colitis, unspecified: Secondary | ICD-10-CM

## 2016-01-26 NOTE — Patient Instructions (Signed)
Call for appointment with any reoccurence of symptoms.

## 2016-01-26 NOTE — Progress Notes (Signed)
Reviewed.    Agree.

## 2016-01-26 NOTE — Progress Notes (Signed)
Patient ID: Edward Chaney, male   DOB: Jul 13, 1981, 35 y.o.   MRN: [id number]   Subjective:    Patient ID: Edward Chaney, male    DOB: 10/26/1980, 35 y.o.   MRN: [id number]  HPI Edward Chaney is a pleasant 35 year old African-American male, new to GI, referred by Dr. Merri Chaney, after ER visit on 01/19/2016. Patient is in generally good health, does have history of asthma. He says he had acute onset on 01/16/2016 with abdominal cramping which was progressive, followed by diarrhea. He did not have any melena or hematochezia, no fever or chills and no nausea or vomiting. He continues to have abdominal cramping and diarrhea through the following day and went to his primary M.D. who then sent him to the emergency room. He says he had been drinking hard liquor on the day of onset of symptoms. Generally he drinks beer on a daily basis. Labs in the emergency room including CBC and CMET were all unremarkable. He had CT of the abdomen and pelvis done that showed mild mucosal thickening of the second portion of the duodenum and jejunum and some mild submucosal edema of the ascending colon to the hepatic flexure. Patient states the diarrhea stopped the following day and his abdominal cramping subsided as well. Feels pretty much back to normal at this point has been eating without difficulty and is having formed stools. Had not had any known infectious exposures he did take 1 dose of Augmentin a couple of days before onset of symptoms but had only had one dose. Family history is negative for GI disease. Review of Systems Pertinent positive and negative review of systems were noted in the above HPI section.  All other review of systems was otherwise negative.  Outpatient Encounter Prescriptions as of 01/26/2016  Medication Sig  . albuterol (PROAIR HFA) 108 (90 BASE) MCG/ACT inhaler Inhale 2 puffs into the lungs 4 (four) times daily as needed for wheezing or shortness of breath.   Marland Kitchen HYDROcodone-acetaminophen  (NORCO/VICODIN) 5-325 MG tablet Take 2 tablets by mouth every 4 (four) hours as needed.  . [DISCONTINUED] azithromycin (ZITHROMAX) 500 MG tablet Take 2 tablets (1,000 mg total) by mouth once. (Patient not taking: Reported on 01/19/2016)  . [DISCONTINUED] ondansetron (ZOFRAN ODT) 8 MG disintegrating tablet  ODT q4 hours prn nausea (Patient not taking: Reported on 01/19/2016)   No facility-administered encounter medications on file as of 01/26/2016.   No Known Allergies Patient Active Problem List   Diagnosis Date Noted  . 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 GERD (gastroesophageal reflux disease) 03/21/2012   Social History   Social History  . Marital Status: Single    Spouse Name: N/A  . Number of Children: 2  . Years of Education: N/A   Occupational History  .  [employer]   Social History Main Topics  . Smoking status: Former Smoker -- 0.20 packs/day for 6 years    Types: Cigarettes    Quit date: 09/06/2009  . Smokeless tobacco: Never Used  . Alcohol Use: 4.2 oz/week    7 Cans of beer per week     Comment: daily 40 oz a day  . Drug Use: No  . Sexual Activity: Yes   Other Topics Concern  . Not on file   Social History Narrative    Mr. Lenker family history includes Allergies in his mother; Asthma in his maternal aunt, maternal uncle, and paternal grandfather; Colon polyps in his father; Diabetes in his maternal uncle; Hyperlipidemia in his father,  mother, and sister.      Objective:    Filed Vitals:   01/26/16 0830  BP: 112/68  Pulse: 68    Physical Exam    well-developed young African-American male in no acute distress, blood pressure 112/68 pulse 68 height 5 foot 11 weight 179. HEENT ;ontraumatic normocephalic EOMI PERRLA sclera anicteric, Cardiovascular; regular rate and rhythm with S1-S2 no murmur rub or gallop, Pulmonary clear bilaterally, Abdomen ;soft nontender nondistended bowel sounds are active there is no palpable mass or hepatosplenomegaly,  Rectal; exam not done, Extremities; no clubbing cyanosis or edema skin warm and dry, Neuropsych; mood and affect appropriate     Assessment & Plan:   #1 35 yo AA male With 4-5 day history of acute abdominal cramping and diarrhea with nonspecific CT findings of mild mucosal thickening of the second portion of the duodenum and mild edema of the descending colon. Patient's symptoms have since completely resolved. I suspect he had an acute infectious gastroenteritis likely viral. There are no symptoms concerning for IBD at this time #2 regular EtOH use.-Patient says he has been drinking much less since this episode, and thinks that this was a wakeup call for him regarding his regular EtOH use.  Plan; Reassurance and observation. No further GI evaluation indicated at this time as his symptoms have completely resolved. He is asked to call should he have any recurrence of pain or diarrhea. We did also discuss general recommendations for daily EtOH use of no more than 2 drinks per 24 hours for men. He plans to significantly reduce his EtOH use.  He will follow-up with Dr. Marina Chaney or myself as needed. Edward Agcaoili [name] PA-C 01/26/2016   Cc: Edward BrunettePharr, Walter, MD

## 2016-02-24 ENCOUNTER — Institutional Professional Consult (permissible substitution): Payer: Managed Care, Other (non HMO) | Admitting: Internal Medicine

## 2016-03-03 ENCOUNTER — Ambulatory Visit: Payer: Managed Care, Other (non HMO) | Admitting: Internal Medicine

## 2016-03-23 ENCOUNTER — Ambulatory Visit: Payer: Managed Care, Other (non HMO) | Admitting: Internal Medicine

## 2017-04-20 IMAGING — CT CT ABD-PELV W/ CM
2 of 4 series · 16 of 46 positions shown, 18 images · IV contrast (APPLIED)
Comparison: None.

CLINICAL DATA: Lower abdominal pain and diarrhea

EXAM:
CT ABDOMEN AND PELVIS WITH CONTRAST
TECHNIQUE: Multidetector CT imaging of the abdomen and pelvis was performed
using the standard protocol following bolus administration of
intravenous contrast.
CONTRAST:  1 W6CDBI-5AA IOPAMIDOL (W6CDBI-5AA) INJECTION 61%

[Series 2: abd/ pelvis 5.0 i30f 1 · axial · 0.64mm/px · z∈[+935,+1350]mm · 13 of 94 slices shown, 15 images]
[im 7/94  soft-tissue]
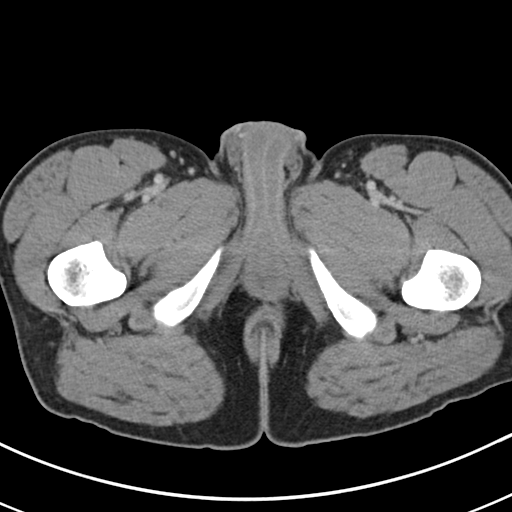
[im 7/94  bone]
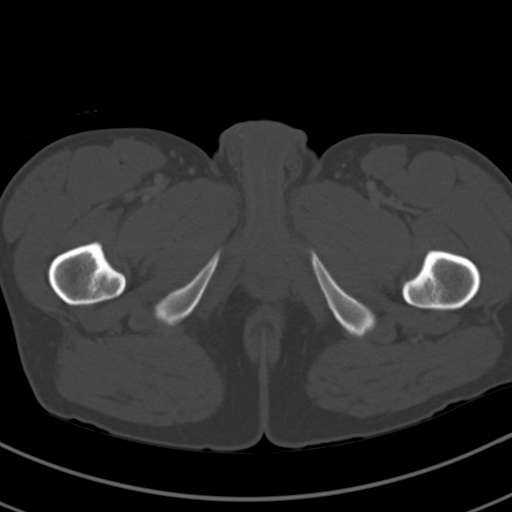
[im 14/94  soft-tissue]
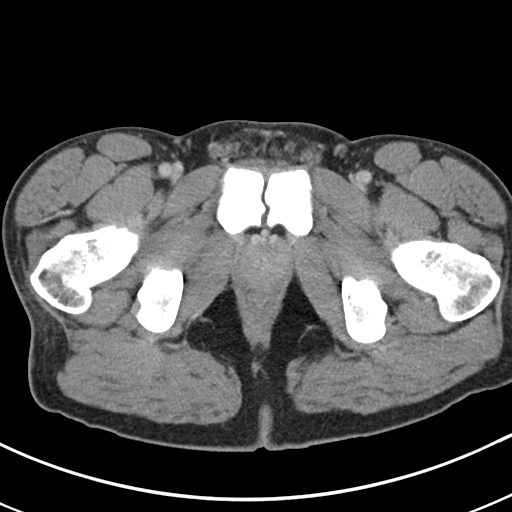
[im 21/94  soft-tissue]
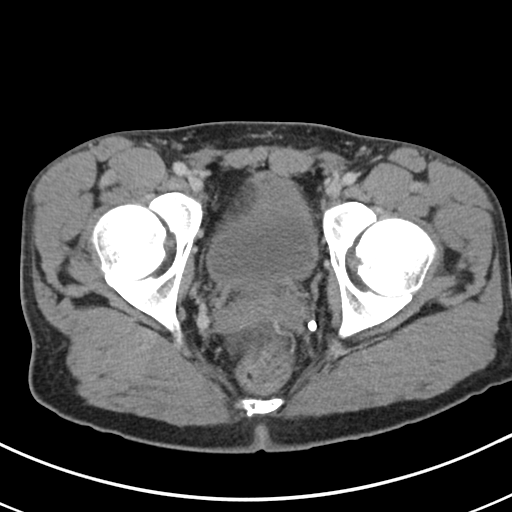
[im 28/94  soft-tissue]
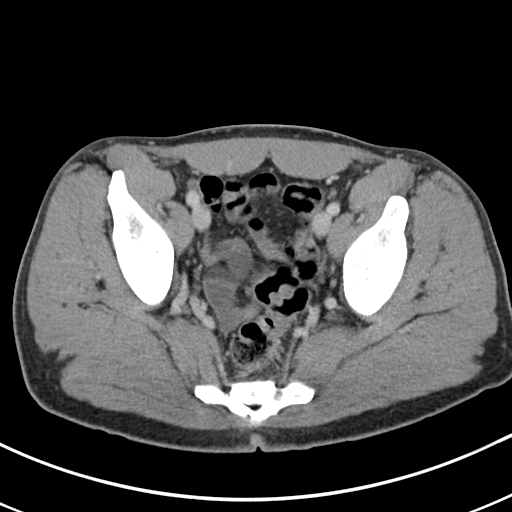
[im 35/94  soft-tissue]
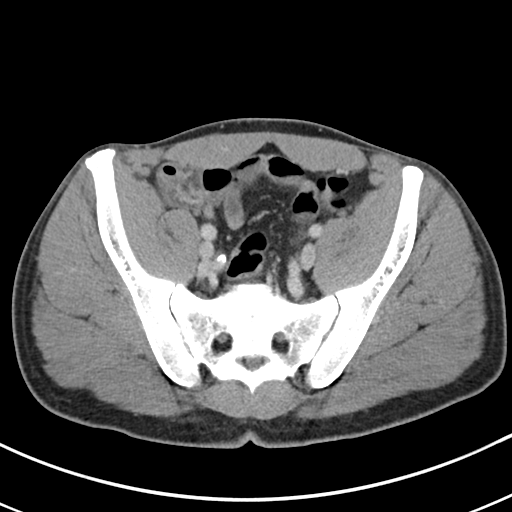
[im 42/94  soft-tissue]
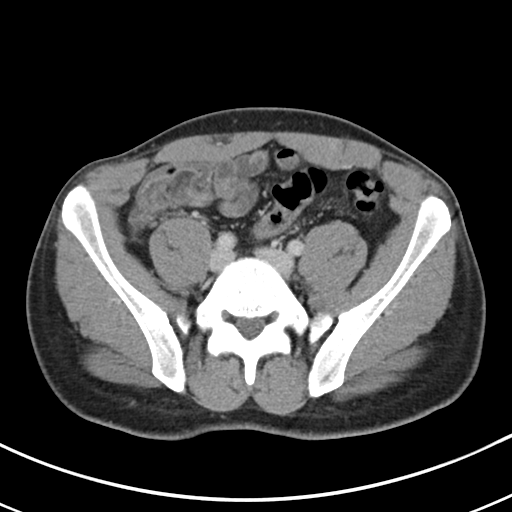
[im 49/94  soft-tissue]
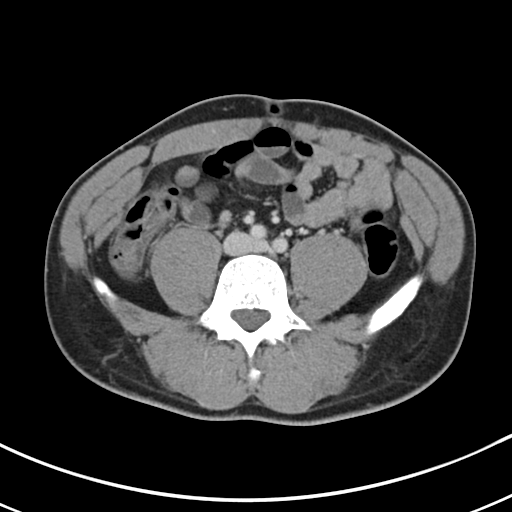
[im 56/94  soft-tissue]
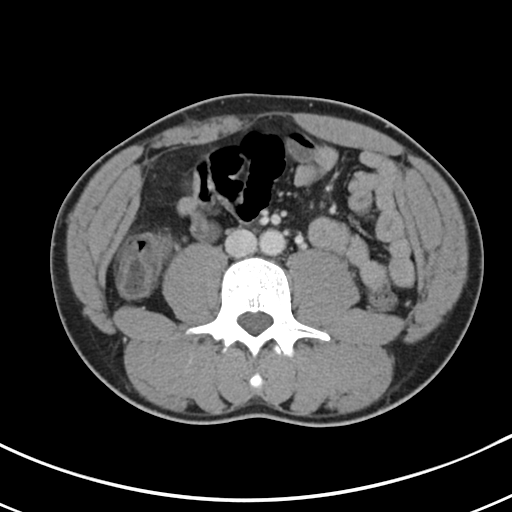
[im 63/94  soft-tissue]
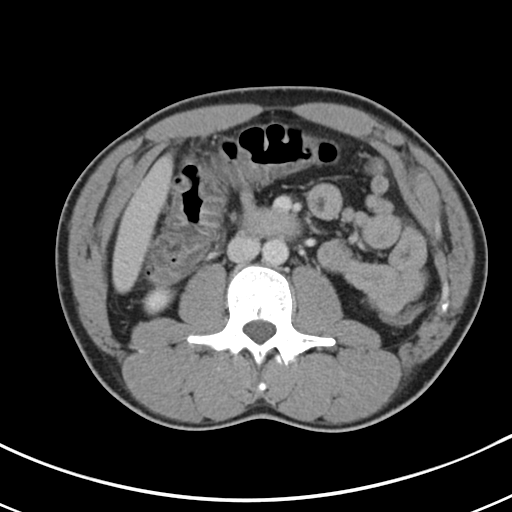
[im 63/94  bone]
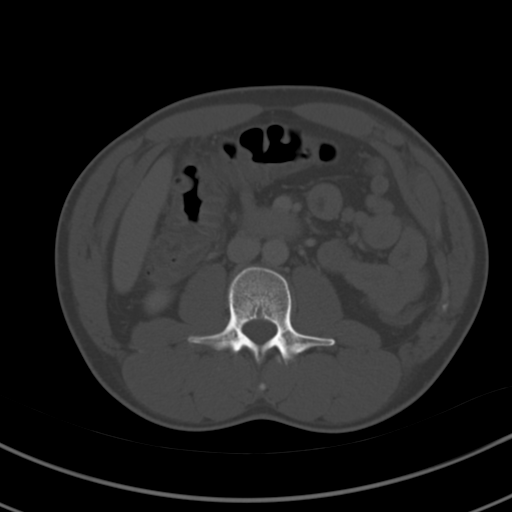
[im 69/94  soft-tissue]
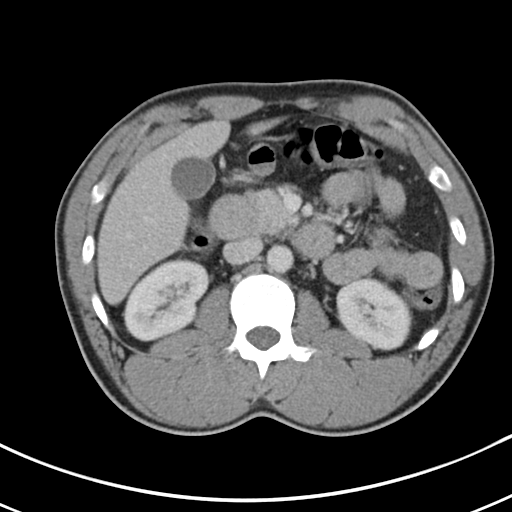
[im 76/94  soft-tissue]
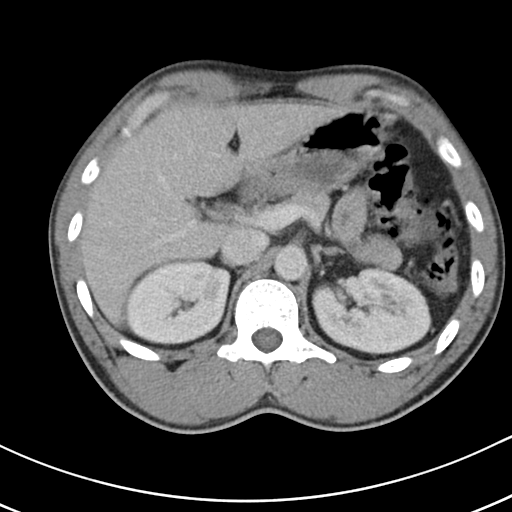
[im 83/94  soft-tissue]
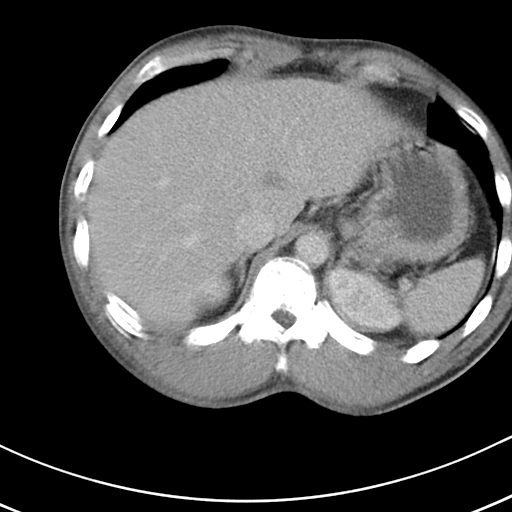
[im 90/94  soft-tissue]
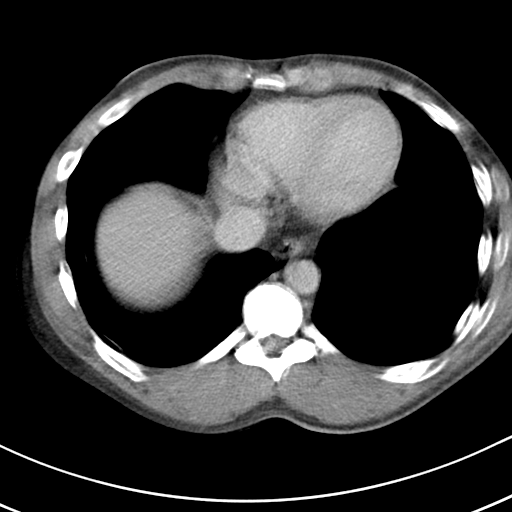

[Series 5: coronal soft tissue · coronal · 0.70mm/px · 3 of 77 slices shown]
[im 26/77  soft-tissue]
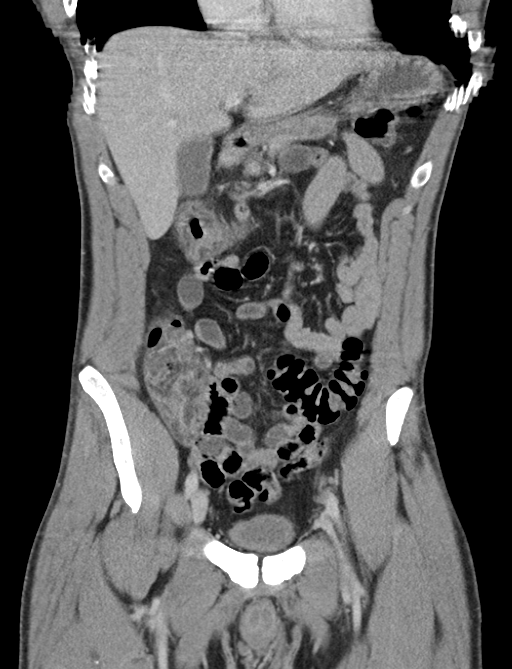
[im 34/77  soft-tissue]
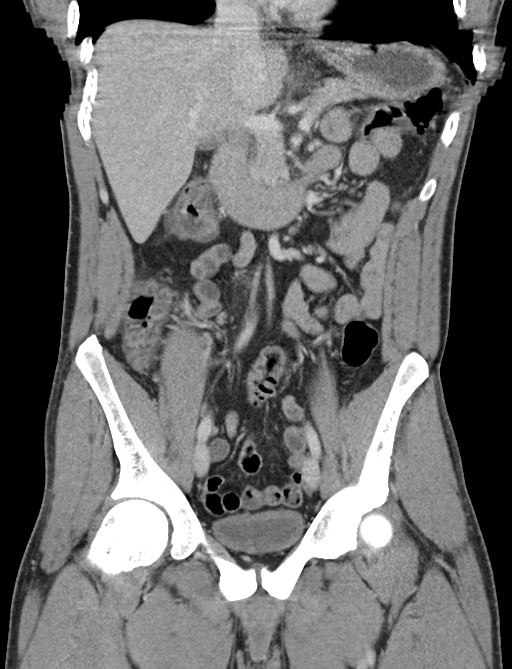
[im 43/77  soft-tissue]
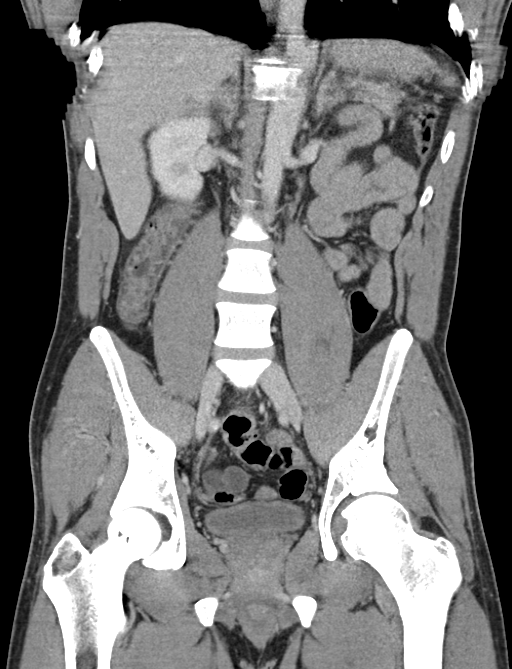

[16 of 46 positions shown; findings below may reference images not displayed]

FINDINGS: Patient motion degrades exam.

Lower chest: Lung bases are clear.

Hepatobiliary: No focal hepatic lesion. No biliary duct dilatation.
Gallbladder is normal. Common bile duct is normal.

Pancreas: Pancreas is normal. No ductal dilatation. No pancreatic
inflammation.

Spleen: Normal

Adrenals/urinary tract: Adrenal glands and kidneys are normal. The
ureters and bladder normal.

Stomach/Bowel: Stomach is normal. Mild mucosal thickening in the
second portion the duodenum. Potential mild mucosal thickening of
the jejunum. The ileum appears normal terminal ileum normal.
Appendix is normal. There is submucosal edema of the ascending colon
through the hepatic flexure into the mid transverse colon. The
descending colon rectum are normal

.

Vascular/Lymphatic: Abdominal aorta is normal caliber. There is no
retroperitoneal or periportal lymphadenopathy. No pelvic
lymphadenopathy.

Reproductive: Prostate normal

Other: Small free fluid the pelvis

Musculoskeletal: No aggressive osseous lesion.
IMPRESSION: 1. Long segments of submucosal edema involving the ascending colon
in proximal transverse colon is most consists with segmental
colitis. In this age group differential would include infectious
colitis versus inflammatory at bowel disease.
2. Mild uniform mucosal thickening of the duodenum and jejunum. This
raise the suspicion for Crohn's disease. Recommend non emergent GI
consultation.

## 2018-11-01 ENCOUNTER — Ambulatory Visit (INDEPENDENT_AMBULATORY_CARE_PROVIDER_SITE_OTHER): Payer: 59 | Admitting: Psychiatry

## 2018-11-01 DIAGNOSIS — F39 Unspecified mood [affective] disorder: Secondary | ICD-10-CM | POA: Diagnosis not present

## 2018-11-01 MED ORDER — CITALOPRAM HYDROBROMIDE 10 MG PO TABS: 10.0000 mg | ORAL_TABLET | Freq: Every day | ORAL | 0 refills | Status: DC

## 2018-11-01 NOTE — Progress Notes (Signed)
[facility] MD/PA/NP Initial Note  11/01/2018 2:35 PM JACOBE SKURA  MRN:  [id number]  Chief Complaint: anxiety depression  HPI: Patient with complaints of anxiety and depression.  1 month ago he was under a lot of stress through his marriage to work and other causes.  He was having abdominal pain they worked him up and all they found was a fatty liver with increased bilirubins.  He has stopped drinking for that month.  Symptoms include crying uncontrollably, hopeless had panic attacks and was scared.  Since he has been on the Celexa for a month is energy and motivation are better anhedonia is better sleep and appetite are better hopelessness is better.  He stayed at the hospital from 09/25/2018 to 10/03/2018.  Only time ever he has had depression. Patient always has anxiety as a kid.  Currently anxiety spells last about 20 minutes where his mind will go crazy, tapestry, rocking, shortness of breath and sweats, no chest pain.  These last about 20 minutes. States that he is in a good mood 90% of the time.  This was before 09/20/2018.  With manic he MDQ was positive with a serious problem.  Talks more, decreased sleep, goal oriented.  He is a better on Celexa Anger and irritability can last for days. Alcohol he stopped 30 days ago before that was drinking 80 ounces of alcohol a day.  Increased amount on the weekends.  Denies blackouts seizures.  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  Since then he has been eating better.  He was also seen at [facility] on Monday Wednesday Fridays starting in in 1 month ago   sit Diagnosis: mood disorder  Past Psychiatric History: This past month is been the only time he is really had any symptoms.  Past Medical History: Fatty liver, macrocytic anemia, asthma.  No diabetes heart disease or high blood pressure. Past Medical History:  Diagnosis Date  . Anxiety   . Asthma   . GERD (gastroesophageal reflux disease)    No past surgical history on  file.  Family Psychiatric History: He has an aunt and uncle with anxiety, nephew with schizophrenic, brother and cousin with alcohol problems.  Family History: Diabetes in an aunt and he had an uncle who had a stroke. Family History  Problem Relation Age of Onset  . Allergies Mother   . Hyperlipidemia Mother   . Asthma Maternal Aunt   . Asthma Maternal Uncle   . Asthma Paternal Grandfather   . Hyperlipidemia Father   . Hyperlipidemia Sister   . Colon polyps Father   . 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  He is married with 2 children.  He is a Curator. He drinks 3 caffeinated beverages a day.  Tobacco 1 black and mild 1 a day.  Alcohol none for 30 days.  No use of drugs. 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  The past he is also used pot and cocaine. Current medications include Celexa 10 mg a day. Allergies negative. Sleep is good now he is working out regularly. Social History   Socioeconomic History  . Marital status: Single    Spouse name: Not on file  . Number of children: 2  . Years of education: Not on file  . Highest education level: Not on file  Occupational History    Employer: [employer]  Social Needs  . Financial resource strain:  Not on file  . Food insecurity:    Worry: Not on file    Inability: Not on file  . Transportation needs:    Medical: Not on file    Non-medical: Not on file  Tobacco Use  . Smoking status: Former Smoker    Packs/day: 0.20    Years: 6.00    Pack years: 1.20    Types: Cigarettes    Last attempt to quit: 09/06/2009    Years since quitting: 9.1  . Smokeless tobacco: Never Used  Substance and Sexual Activity  . Alcohol use: Yes    Alcohol/week: 7.0 standard drinks    Types: 7 Cans of beer per week    Comment: daily 40 oz a day  . Drug use: No  . Sexual activity: Yes  Lifestyle  . Physical activity:    Days per week: Not on file    Minutes per session: Not on  file  . Stress: Not on file  Relationships  . Social connections:    Talks on phone: Not on file    Gets together: Not on file    Attends religious service: Not on file    Active member of club or organization: Not on file    Attends meetings of clubs or organizations: Not on file    Relationship status: Not on file  Other Topics Concern  . Not on file  Social History Narrative  . Not on file    Allergies: No Known Allergies  Metabolic Disorder Labs: No results found for: HGBA1C, MPG No results found for: PROLACTIN No results found for: CHOL, TRIG, HDL, CHOLHDL, VLDL, LDLCALC No results found for: TSH  Therapeutic Level Labs: No results found for: LITHIUM No results found for: VALPROATE No components found for:  CBMZ  Current Medications: Current Outpatient Medications  Medication Sig Dispense Refill  . albuterol (PROAIR HFA) 108 (90 BASE) MCG/ACT inhaler Inhale 2 puffs into the lungs 4 (four) times daily as needed for wheezing or shortness of breath.     Marland Kitchen HYDROcodone-acetaminophen (NORCO/VICODIN) 5-325 MG tablet Take 2 tablets by mouth every 4 (four) hours as needed. 10 tablet 0   No current facility-administered medications for this visit.     Medication Side Effects: none  Orders placed this visit: Continue Celexa 10 mg a day.  Mood diary given.  Will follow mood. Next visit we will get release of information from [facility] where he was in the hospital last month for 5 days.  Also for [facility].  Psychiatric Specialty Exam:  ROS ROS is negative.  Blood pressure is 123/80 with a pulse of 65.  He has a weight of 170 pounds and is 5 foot 11-1/2 inches.  General Appearance: Casual  Eye Contact:  Good  Speech:  Clear and Coherent  Volume:  Normal  Mood:  Euthymic  Affect:  Appropriate  Thought Process:  Linear  Orientation:  Full (Time, Place, and Person)  Thought Content: Logical   Suicidal Thoughts:  No  Homicidal Thoughts:  No  Memory:  WNL   Judgement:  Good  Insight:  Good  Psychomotor Activity:  Normal  Concentration:  Concentration: Good  Recall:  Good  Fund of Knowledge: Good  Language: Good  Assets:  Desire for Improvement  ADL's:  Intact  Cognition: WNL  Prognosis:  Good   Screenings:   Receiving Psychotherapy: No   Treatment Plan/Recommendations: Patient is to continue his Celexa 10 mg a day.  He was given a  mood disorder mood diary to keep.  As mentioned we will get [facility]. He will return in 1 month.    Anne Fulay Ellise Kovack, PA-C

## 2018-11-28 ENCOUNTER — Ambulatory Visit: Payer: 59 | Admitting: Psychiatry

## 2018-12-06 ENCOUNTER — Other Ambulatory Visit: Payer: Self-pay | Admitting: Psychiatry

## 2022-04-08 ENCOUNTER — Other Ambulatory Visit: Payer: Self-pay | Admitting: Internal Medicine

## 2022-04-08 DIAGNOSIS — I2510 Atherosclerotic heart disease of native coronary artery without angina pectoris: Secondary | ICD-10-CM

## 2022-06-25 ENCOUNTER — Other Ambulatory Visit: Payer: Managed Care, Other (non HMO)

## 2022-07-01 ENCOUNTER — Inpatient Hospital Stay: Admission: RE | Admit: 2022-07-01 | Payer: Managed Care, Other (non HMO) | Source: Ambulatory Visit

## 2022-08-13 ENCOUNTER — Other Ambulatory Visit: Payer: Self-pay

## 2022-08-16 ENCOUNTER — Other Ambulatory Visit: Payer: Self-pay

## 2022-08-20 ENCOUNTER — Ambulatory Visit
Admission: RE | Admit: 2022-08-20 | Discharge: 2022-08-20 | Disposition: A | Discharge status: Home / Self Care | Payer: No Typology Code available for payment source | Source: Ambulatory Visit | Attending: Internal Medicine | Admitting: Internal Medicine

## 2022-08-20 DIAGNOSIS — I2510 Atherosclerotic heart disease of native coronary artery without angina pectoris: Secondary | ICD-10-CM

## 2022-09-02 ENCOUNTER — Telehealth: Payer: 59 | Admitting: Physician Assistant

## 2022-09-02 DIAGNOSIS — B029 Zoster without complications: Secondary | ICD-10-CM | POA: Diagnosis not present

## 2022-09-02 MED ORDER — VALACYCLOVIR HCL 1 G PO TABS: 1000.0000 mg | ORAL_TABLET | Freq: Three times a day (TID) | ORAL | 0 refills | Status: AC

## 2022-09-02 MED ORDER — GABAPENTIN 300 MG PO CAPS: 300.0000 mg | ORAL_CAPSULE | Freq: Two times a day (BID) | ORAL | 0 refills | Status: AC

## 2022-09-02 NOTE — Progress Notes (Signed)
E-visit for Shingles   We are sorry that you are not feeling well. Here is how we plan to help!  Based on what you shared with me it looks like you have shingles.  Shingles or herpes zoster, is a common infection of the nerves.  It is a painful rash caused by the herpes zoster virus.  This is the same virus that causes chickenpox.  After a person has chickenpox, the virus remains inactive in the nerve cells.  Years later, the virus can become active again and travel to the skin.  It typically will appear on one side of the face or body.  Burning or shooting pain, tingling, or itching are early signs of the infection.  Blisters typically scab over in 7 to 10 days and clear up within 2-4 weeks. Shingles is only contagious to people that have never had the chickenpox, the chickenpox vaccine, or anyone who has a compromised immune system.  You should avoid contact with these type of people until your blisters scab over.  I have prescribed Valacyclovir 1g three times daily for 7 days and also Gabapentin 300mg twice daily as needed for pain   HOME CARE: . Apply ice packs (wrapped in a thin towel), cool compresses, or soak in cool bath to help reduce pain. . Use calamine lotion to calm itchy skin. . Avoid scratching the rash. . Avoid direct sunlight.  GET HELP RIGHT AWAY IF: . Symptoms that don't away after treatment. . A rash or blisters near your eye. . Increased drainage, fever, or rash after treatment. . Severe pain that doesn't go away.   MAKE SURE YOU    Understand these instructions.  Will watch your condition.  Will get help right away if you are not doing well or get worse.  Thank you for choosing an e-visit. Your e-visit answers were reviewed by a board certified advanced clinical practitioner to complete your personal care plan. Depending upon the condition, your plan could have included both over the counter or prescription medications.  Please review your pharmacy choice.  Make sure the pharmacy is open so you can pick up prescription now. If there is a problem, you may contact your provider through MyChart messaging and have the prescription routed to another pharmacy.  Your safety is important to us. If you have drug allergies check your prescription carefully.   For the next 24 hours you can use MyChart to ask questions about today's visit, request a non-urgent call back, or ask for a work or school excuse.  You will get an email in the next two days asking about your experience. I hope that your e-visit has been valuable and will speed your recovery  

## 2022-09-02 NOTE — Progress Notes (Signed)
I have spent 5 minutes in review of e-visit questionnaire, review and updating patient chart, medical decision making and response to patient.   Shakerra Red Cody Narjis Mira, PA-C
